# Patient Record
Sex: Female | Born: 1995 | Race: Black or African American | Hispanic: No | Marital: Single | Smoking: Never smoker
Health system: Southern US, Community
[De-identification: ages and names within clinical notes are randomized; demographics above are authoritative.]

---
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
## 2016-01-01 ENCOUNTER — Emergency Department (HOSPITAL_COMMUNITY)
Admission: EM | Admit: 2016-01-01 | Discharge: 2016-01-01 | Disposition: A | Discharge status: Home / Self Care | Payer: Medicaid Other | Attending: Emergency Medicine | Admitting: Emergency Medicine

## 2016-01-01 ENCOUNTER — Encounter (HOSPITAL_COMMUNITY): Payer: Self-pay | Admitting: *Deleted

## 2016-01-01 ENCOUNTER — Emergency Department (HOSPITAL_COMMUNITY): Payer: Medicaid Other

## 2016-01-01 DIAGNOSIS — S7011XA Contusion of right thigh, initial encounter: Secondary | ICD-10-CM | POA: Insufficient documentation

## 2016-01-01 DIAGNOSIS — Z3202 Encounter for pregnancy test, result negative: Secondary | ICD-10-CM | POA: Diagnosis not present

## 2016-01-01 DIAGNOSIS — Y92410 Unspecified street and highway as the place of occurrence of the external cause: Secondary | ICD-10-CM | POA: Insufficient documentation

## 2016-01-01 DIAGNOSIS — Y998 Other external cause status: Secondary | ICD-10-CM | POA: Insufficient documentation

## 2016-01-01 DIAGNOSIS — Y9389 Activity, other specified: Secondary | ICD-10-CM | POA: Insufficient documentation

## 2016-01-01 DIAGNOSIS — S8011XA Contusion of right lower leg, initial encounter: Secondary | ICD-10-CM

## 2016-01-01 DIAGNOSIS — S8991XA Unspecified injury of right lower leg, initial encounter: Secondary | ICD-10-CM | POA: Diagnosis present

## 2016-01-01 LAB — I-STAT BETA HCG BLOOD, ED (MC, WL, AP ONLY)

## 2016-01-01 MED ORDER — TRAMADOL HCL 50 MG PO TABS: 50.0000 mg | ORAL_TABLET | Freq: Four times a day (QID) | ORAL | Status: AC | PRN

## 2016-01-01 MED ORDER — SULFAMETHOXAZOLE-TRIMETHOPRIM 800-160 MG PO TABS: 1.0000 | ORAL_TABLET | Freq: Two times a day (BID) | ORAL | Status: AC

## 2016-01-01 MED ORDER — CYCLOBENZAPRINE HCL 10 MG PO TABS
5.0000 mg | ORAL_TABLET | Freq: Once | ORAL | Status: AC
  Administered 2016-01-01: 5 mg via ORAL
  Filled 2016-01-01: qty 1

## 2016-01-01 MED ORDER — IBUPROFEN 800 MG PO TABS: 800.0000 mg | ORAL_TABLET | Freq: Three times a day (TID) | ORAL | Status: DC

## 2016-01-01 MED ORDER — TRAMADOL HCL 50 MG PO TABS
50.0000 mg | ORAL_TABLET | Freq: Once | ORAL | Status: AC
  Administered 2016-01-01: 50 mg via ORAL
  Filled 2016-01-01: qty 1

## 2016-01-01 MED ORDER — CYCLOBENZAPRINE HCL 5 MG PO TABS: 5.0000 mg | ORAL_TABLET | Freq: Two times a day (BID) | ORAL | Status: AC | PRN

## 2016-01-01 NOTE — ED Notes (Signed)
Pt taken to xray 
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
## 2016-01-01 NOTE — ED Notes (Signed)
Pt able to ambulate without assistance.  
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
## 2016-02-28 ENCOUNTER — Emergency Department (HOSPITAL_COMMUNITY)
Admission: EM | Admit: 2016-02-28 | Discharge: 2016-02-28 | Disposition: A | Discharge status: Home / Self Care | Payer: Medicaid Other | Attending: Emergency Medicine | Admitting: Emergency Medicine

## 2016-02-28 ENCOUNTER — Encounter (HOSPITAL_COMMUNITY): Payer: Self-pay

## 2016-02-28 DIAGNOSIS — Z113 Encounter for screening for infections with a predominantly sexual mode of transmission: Secondary | ICD-10-CM | POA: Insufficient documentation

## 2016-02-28 DIAGNOSIS — Z79899 Other long term (current) drug therapy: Secondary | ICD-10-CM | POA: Insufficient documentation

## 2016-02-28 NOTE — ED Provider Notes (Signed)
[facility]EM[facility]Y DEPT Provider Note   CSN: [id number] Arrival date & time: 02/28/16  4098  First Provider Contact:  First MD Initiated Contact with Patient 02/28/16 1046     History   Chief Complaint Encounter for STI screening   HPI  Misty Boone is an 20 y.o. female with no significant PMH who presents to the ED for STI screening. She is asymptomatic and has no complaints. She specifically is inquiring about herpes testing as she was told by a friend that a previous sexual partner has herpes. She denies rash or new lesions. Denies vaginal discharge or bleeding. Denies abdominal pain, n/v/d, fever, chills.  History reviewed. No pertinent past medical history.  There are no active problems to display for this patient.   History reviewed. No pertinent surgical history.  OB History    No data available       Home Medications    Prior to Admission medications   Medication Sig Start Date End Date Taking? Authorizing Provider  cyclobenzaprine (FLEXERIL) 5 MG tablet Take 1 tablet (5 mg total) by mouth 2 (two) times daily as needed. 01/01/16   Tiffany Neva Seat, PA-C  ibuprofen (ADVIL,MOTRIN) 800 MG tablet Take 1 tablet (800 mg total) by mouth 3 (three) times daily. 01/01/16   Tiffany Neva Seat, PA-C  traMADol (ULTRAM) 50 MG tablet Take 1 tablet (50 mg total) by mouth every 6 (six) hours as needed. 01/01/16   [name], PA-C    Family History No family history on file.  Social History Social History  Substance Use Topics  . Smoking status: Never Smoker  . Smokeless tobacco: Never Used  . Alcohol use No     Allergies   Review of patient's allergies indicates no known allergies.   Review of Systems Review of Systems  All other systems reviewed and are negative.    Physical Exam Updated Vital Signs BP 122/59   Pulse 80   Temp 98.6 F (37 C) (Oral)   Resp 16   SpO2 100%   Physical Exam  Constitutional: She is oriented to person, place, and time. No distress.    HENT:  Head: Atraumatic.  Right Ear: External ear normal.  Left Ear: External ear normal.  Nose: Nose normal.  Eyes: Conjunctivae are normal. No scleral icterus.  Cardiovascular: Normal rate and regular rhythm.   Pulmonary/Chest: Effort normal. No respiratory distress.  Abdominal: She exhibits no distension.  Neurological: She is alert and oriented to person, place, and time.  Skin: Skin is warm and dry. She is not diaphoretic.  Psychiatric: She has a normal mood and affect. Her behavior is normal.  Nursing note and vitals reviewed.    ED Treatments / Results  Labs (all labs ordered are listed, but only abnormal results are displayed) Labs Reviewed  HIV ANTIBODY (ROUTINE TESTING)  RPR  GC/CHLAMYDIA PROBE AMP ([place]) NOT AT [facility]    EKG  EKG Interpretation None       Radiology No results found.  Procedures Procedures (including critical care time)  Medications Ordered in ED Medications - No data to display   Initial Impression / Assessment and Plan / ED Course  I have reviewed the triage vital signs and the nursing notes.  Pertinent labs & imaging results that were available during my care of the patient were reviewed by me and considered in my medical decision making (see chart for details).  Clinical Course    Will send HIV, RPR, GC, chlamydia. I discussed with pt that there  is no good testing for HSV in asymptomatic people, nor is there good prophylactic treatment. No other tx today since pt is asymptomatic. Encouraged safe sex practices and ER return precautions were given.  Final Clinical Impressions(s) / ED Diagnoses   Final diagnoses:  Encounter for screening examination for sexually transmitted disease    New Prescriptions New Prescriptions   No medications on file     Carlene Coria, Cordelia Poche 02/28/16 1125    Pricilla Loveless, MD 03/03/16 [phone]

## 2016-02-28 NOTE — ED Triage Notes (Signed)
Patient was told by 3rd party that she may have been exposed to STD, no complaints

## 2016-02-28 NOTE — Discharge Instructions (Signed)
We will call you if any of your tests come back positive.

## 2016-02-29 LAB — GC/CHLAMYDIA PROBE AMP (~~LOC~~) NOT AT ARMC
CHLAMYDIA, DNA PROBE: NEGATIVE
Neisseria Gonorrhea: NEGATIVE

## 2016-02-29 LAB — HIV ANTIBODY (ROUTINE TESTING W REFLEX): HIV Screen 4th Generation wRfx: NONREACTIVE

## 2016-02-29 LAB — RPR: RPR Ser Ql: NONREACTIVE

## 2016-08-14 ENCOUNTER — Emergency Department (HOSPITAL_COMMUNITY): Payer: Self-pay

## 2016-08-14 ENCOUNTER — Encounter (HOSPITAL_COMMUNITY): Payer: Self-pay | Admitting: *Deleted

## 2016-08-14 ENCOUNTER — Emergency Department (HOSPITAL_COMMUNITY)
Admission: EM | Admit: 2016-08-14 | Discharge: 2016-08-14 | Disposition: A | Discharge status: Home / Self Care | Payer: Self-pay | Attending: Emergency Medicine | Admitting: Emergency Medicine

## 2016-08-14 DIAGNOSIS — Y999 Unspecified external cause status: Secondary | ICD-10-CM | POA: Insufficient documentation

## 2016-08-14 DIAGNOSIS — S8001XA Contusion of right knee, initial encounter: Secondary | ICD-10-CM | POA: Insufficient documentation

## 2016-08-14 DIAGNOSIS — Y929 Unspecified place or not applicable: Secondary | ICD-10-CM | POA: Insufficient documentation

## 2016-08-14 DIAGNOSIS — Y939 Activity, unspecified: Secondary | ICD-10-CM | POA: Insufficient documentation

## 2016-08-14 DIAGNOSIS — M25561 Pain in right knee: Secondary | ICD-10-CM
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
## 2016-08-14 NOTE — ED Notes (Signed)
No answer x2 

## 2016-08-14 NOTE — Discharge Instructions (Signed)
Wear knee sleeve for at least 2 weeks for stabilization of knee. Use crutches as needed for comfort. Ice and elevate knee throughout the day, using ice pack for no more than 20 minutes every hour. Alternate between tylenol and motrin as needed for pain relief. Call orthopedic follow up today or tomorrow to schedule followup appointment for recheck of ongoing knee pain in 1-2 weeks that can be canceled with a 24-48 hour notice if complete resolution of pain. Return to the ER for changes or worsening symptoms. °
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
## 2017-02-27 ENCOUNTER — Encounter (HOSPITAL_COMMUNITY): Payer: Self-pay | Admitting: Emergency Medicine

## 2017-02-27 ENCOUNTER — Emergency Department (HOSPITAL_COMMUNITY)
Admission: EM | Admit: 2017-02-27 | Discharge: 2017-02-27 | Disposition: A | Discharge status: Home / Self Care | Payer: Medicaid Other | Attending: Emergency Medicine | Admitting: Emergency Medicine

## 2017-02-27 DIAGNOSIS — R0789 Other chest pain: Secondary | ICD-10-CM

## 2017-02-27 DIAGNOSIS — K5900 Constipation, unspecified: Secondary | ICD-10-CM | POA: Insufficient documentation

## 2017-02-27 DIAGNOSIS — K602 Anal fissure, unspecified: Secondary | ICD-10-CM | POA: Insufficient documentation

## 2017-02-27 LAB — COMPREHENSIVE METABOLIC PANEL
ALBUMIN: 3.8 g/dL (ref 3.5–5.0)
ALK PHOS: 81 U/L (ref 38–126)
ALT: 19 U/L (ref 14–54)
AST: 21 U/L (ref 15–41)
Anion gap: 9 (ref 5–15)
BILIRUBIN TOTAL: 0.3 mg/dL (ref 0.3–1.2)
BUN: 10 mg/dL (ref 6–20)
CHLORIDE: 107 mmol/L (ref 101–111)
CO2: 23 mmol/L (ref 22–32)
CREATININE: 0.53 mg/dL (ref 0.44–1.00)
Calcium: 8.9 mg/dL (ref 8.9–10.3)
GFR calc non Af Amer: >60 mL/min (ref >60)
Glucose, Bld: 104 mg/dL — ABNORMAL HIGH (ref 65–99)
POTASSIUM: 3.8 mmol/L (ref 3.5–5.1)
SODIUM: 139 mmol/L (ref 135–145)
TOTAL PROTEIN: 7.6 g/dL (ref 6.5–8.1)

## 2017-02-27 LAB — I-STAT BETA HCG BLOOD, ED (MC, WL, AP ONLY)

## 2017-02-27 LAB — CBC
HCT: 36.1 % (ref 36.0–46.0)
Hemoglobin: 12.5 g/dL (ref 12.0–15.0)
MCH: 29.4 pg (ref 26.0–34.0)
MCHC: 34.6 g/dL (ref 30.0–36.0)
MCV: 84.9 fL (ref 78.0–100.0)
PLATELETS: 368 10*3/uL (ref 150–400)
RBC: 4.25 MIL/uL (ref 3.87–5.11)
RDW: 12.8 % (ref 11.5–15.5)
WBC: 7 10*3/uL (ref 4.0–10.5)

## 2017-02-27 LAB — ABO/RH: ABO/RH(D): O POS

## 2017-02-27 LAB — TYPE AND SCREEN
ABO/RH(D): O POS
ANTIBODY SCREEN: NEGATIVE

## 2017-02-27 NOTE — Discharge Instructions (Signed)
Recommend ibuprofen and warm compresses for the soreness in your chest. Recommend a high fiber diet for constipation, as well as 3-4 days of daily Miralax and a stool softener until constipation resolves. Tuck's medicated pads (sold over the counter) can offer relief of the pain of the fissure.

## 2017-02-27 NOTE — ED Triage Notes (Signed)
Pt reports R upper breast pain for the past 2 days.  Pt also reports she had dark tarry stool today. No abd pain or emesis.

## 2017-02-27 NOTE — ED Provider Notes (Signed)
[facility]-EMERGENCY DEPT Provider Note   CSN: [id number] Arrival date & time: 02/27/17  1906     History   Chief Complaint Chief Complaint  Patient presents with  . Breast Pain  . Rectal Bleeding    HPI Misty Boone is a 21 y.o. female.  Patient presents with complaint of right breast pain for the past 2 days. No left sided pain. No nipple discharge or bleeding. No known injury. She also reports bleeding with bowel movement this morning. She has had 2 bowel movements today. Bleeding was with the first and not seen with the second. She has been constipated and states she had to strain for the initial BM today. No abdominal pain. No melena.   The history is provided by the patient. No language interpreter was used.  Rectal Bleeding  Associated symptoms: no abdominal pain, no fever and no vomiting     History reviewed. No pertinent past medical history.  There are no active problems to display for this patient.   History reviewed. No pertinent surgical history.  OB History    No data available       Home Medications    Prior to Admission medications   Medication Sig Start Date End Date Taking? Authorizing Provider  cyclobenzaprine (FLEXERIL) 5 MG tablet Take 1 tablet (5 mg total) by mouth 2 (two) times daily as needed. Patient not taking: Reported on 02/27/2017 01/01/16   Marlon PelGreene, Tiffany, PA-C  ibuprofen (ADVIL,MOTRIN) 800 MG tablet Take 1 tablet (800 mg total) by mouth 3 (three) times daily. Patient not taking: Reported on 02/27/2017 01/01/16   Marlon PelGreene, Tiffany, PA-C  traMADol (ULTRAM) 50 MG tablet Take 1 tablet (50 mg total) by mouth every 6 (six) hours as needed. Patient not taking: Reported on 02/27/2017 01/01/16   Marlon PelGreene, Tiffany, PA-C    Family History History reviewed. No pertinent family history.  Social History Social History  Substance Use Topics  . Smoking status: Never Smoker  . Smokeless tobacco: Never Used  . Alcohol use No     Allergies   Patient has  no known allergies.   Review of Systems Review of Systems  Constitutional: Negative for chills and fever.  Respiratory: Negative.   Cardiovascular: Positive for chest pain (c/o right breast pain).  Gastrointestinal: Positive for blood in stool, constipation and hematochezia. Negative for abdominal pain, nausea and vomiting.  Genitourinary: Negative.   Musculoskeletal: Negative.  Negative for back pain.  Skin: Negative.   Neurological: Negative.      Physical Exam Updated Vital Signs BP 112/84 (BP Location: Left Arm)   Pulse 85   Temp 98.2 F (36.8 C) (Oral)   Resp 18   SpO2 100%   Physical Exam  Constitutional: She appears well-developed and well-nourished.  HENT:  Head: Normocephalic.  Neck: Normal range of motion. Neck supple.  Cardiovascular: Normal rate.   Pulmonary/Chest: Effort normal. She exhibits tenderness.    No breast mass palpable. Tender to right parasternal upper chest. No swelling discoloration or wound.  Abdominal: Soft. Bowel sounds are normal. She exhibits no distension. There is no tenderness. There is no rebound and no guarding.  Genitourinary:  Genitourinary Comments: Small fissure at the 6:00 o'clock position of rectum.  Musculoskeletal: Normal range of motion.  Neurological: She is alert.  Skin: Skin is warm and dry. No rash noted.  Psychiatric: She has a normal mood and affect.     ED Treatments / Results  Labs (all labs ordered are listed, but only abnormal results  are displayed) Labs Reviewed  COMPREHENSIVE METABOLIC PANEL - Abnormal; Notable for the following:       Result Value   Glucose, Bld 104 (*)    All other components within normal limits  CBC  I-STAT BETA HCG BLOOD, ED ([facility], [facility], [facility] ONLY)  POC OCCULT BLOOD, ED  TYPE AND SCREEN  ABO/RH   Results for orders placed or performed during the hospital encounter of 02/27/17  Comprehensive metabolic panel  Result Value Ref Range   Sodium 139 135 - 145 mmol/L   Potassium 3.8 3.5 -  5.1 mmol/L   Chloride 107 101 - 111 mmol/L   CO2 23 22 - 32 mmol/L   Glucose, Bld 104 (H) 65 - 99 mg/dL   BUN 10 6 - 20 mg/dL   Creatinine, Ser 1.610.53 0.44 - 1.00 mg/dL   Calcium 8.9 8.9 - 09.610.3 mg/dL   Total Protein 7.6 6.5 - 8.1 g/dL   Albumin 3.8 3.5 - 5.0 g/dL   AST 21 15 - 41 U/L   ALT 19 14 - 54 U/L   Alkaline Phosphatase 81 38 - 126 U/L   Total Bilirubin 0.3 0.3 - 1.2 mg/dL   GFR calc non Af Amer >60 >60 mL/min   GFR calc Af Amer >60 >60 mL/min   Anion gap 9 5 - 15  CBC  Result Value Ref Range   WBC 7.0 4.0 - 10.5 K/uL   RBC 4.25 3.87 - 5.11 MIL/uL   Hemoglobin 12.5 12.0 - 15.0 g/dL   HCT 04.536.1 40.936.0 - 81.146.0 %   MCV 84.9 78.0 - 100.0 fL   MCH 29.4 26.0 - 34.0 pg   MCHC 34.6 30.0 - 36.0 g/dL   RDW 91.412.8 78.211.5 - 95.615.5 %   Platelets 368 150 - 400 K/uL  I-Stat beta hCG blood, ED  Result Value Ref Range   I-stat hCG, quantitative <5.0 <5 mIU/mL   Comment 3          [facility]  Result Value Ref Range   ABO/RH(D) O POS    Antibody Screen NEG    Sample Expiration 03/02/2017   ABO/Rh  Result Value Ref Range   ABO/RH(D) O POS     EKG  EKG Interpretation None       Radiology No results found.  Procedures Procedures (including critical care time)  Medications Ordered in ED Medications - No data to display   Initial Impression / Assessment and Plan / ED Course  I have reviewed the triage vital signs and the nursing notes.  Pertinent labs & imaging results that were available during my care of the patient were reviewed by me and considered in my medical decision making (see chart for details).     Patient presents with concern for right breast pain x 2 days. Breast exam unremarkable. There is chest wall tenderness without evidence of infection.   She has been constipated and today noticed bleeding with bowel movement. She is found to have an anal fissure. No concern for GI bleeding.   She appears well and can be discharged home  with recommendations for supportive care.   Final Clinical Impressions(s) / ED Diagnoses   Final diagnoses:  None   1. Anal fissure 2. Chest wall pain  New Prescriptions New Prescriptions   No medications on file     Danne HarborUpstill, Sydelle Sherfield, PA-C 02/27/17 2331    Mancel BaleWentz, Elliott, MD 02/28/17 0002

## 2017-03-13 ENCOUNTER — Encounter (HOSPITAL_COMMUNITY): Payer: Self-pay | Admitting: Emergency Medicine

## 2017-03-13 DIAGNOSIS — L02412 Cutaneous abscess of left axilla: Secondary | ICD-10-CM | POA: Insufficient documentation

## 2017-03-13 NOTE — ED Triage Notes (Signed)
Pt reports having abscess under left axilla that has been ongoing for the last 3 days. Pt reports abscess is appx size of quarter in diameter.

## 2017-03-14 ENCOUNTER — Emergency Department (HOSPITAL_COMMUNITY)
Admission: EM | Admit: 2017-03-14 | Discharge: 2017-03-14 | Disposition: A | Discharge status: Home / Self Care | Payer: Medicaid Other | Attending: Emergency Medicine | Admitting: Emergency Medicine

## 2017-03-14 DIAGNOSIS — L02412 Cutaneous abscess of left axilla: Secondary | ICD-10-CM

## 2017-03-14 MED ORDER — IBUPROFEN 800 MG PO TABS: 800.0000 mg | ORAL_TABLET | Freq: Three times a day (TID) | ORAL | 0 refills | Status: AC

## 2017-03-14 MED ORDER — LIDOCAINE-EPINEPHRINE 2 %-1:200000 IJ SOLN
20.0000 mL | Freq: Once | INTRAMUSCULAR | Status: AC
Start: 2017-03-14 — End: 2017-03-14
  Administered 2017-03-14: 20 mL
  Filled 2017-03-14: qty 20

## 2017-03-14 NOTE — ED Notes (Signed)
I&D supplies at bedside

## 2017-03-14 NOTE — ED Provider Notes (Signed)
[facility]-EMERGENCY DEPT Provider Note   CSN: [id number] Arrival date & time: 03/13/17  2154     History   Chief Complaint Chief Complaint  Patient presents with  . Abscess    HPI Misty Boone is a 21 y.o. female.  The history is provided by the patient. No language interpreter was used.  Abscess  Abscess location: Axilla. Size:  1x4cm Abscess quality: painful and redness   Red streaking: no   Duration:  3 days Progression:  Worsening Pain details:    Quality:  Aching and throbbing   Severity:  Moderate   Duration:  3 days   Progression:  Worsening Chronicity:  Recurrent Relieved by:  Nothing Exacerbated by: movement of the left arm. Ineffective treatments:  None tried Associated symptoms: no fatigue, no fever, no nausea and no vomiting   Risk factors: prior abscess     History reviewed. No pertinent past medical history.  There are no active problems to display for this patient.   History reviewed. No pertinent surgical history.  OB History    No data available       Home Medications    Prior to Admission medications   Medication Sig Start Date End Date Taking? Authorizing Provider  cyclobenzaprine (FLEXERIL) 5 MG tablet Take 1 tablet (5 mg total) by mouth 2 (two) times daily as needed. Patient not taking: Reported on 02/27/2017 01/01/16   [name], PA-C  ibuprofen (ADVIL,MOTRIN) 800 MG tablet Take 1 tablet (800 mg total) by mouth 3 (three) times daily. 03/14/17   Antony Madura, PA-C  traMADol (ULTRAM) 50 MG tablet Take 1 tablet (50 mg total) by mouth every 6 (six) hours as needed. Patient not taking: Reported on 02/27/2017 01/01/16   [name], PA-C    Family History History reviewed. No pertinent family history.  Social History Social History  Substance Use Topics  . Smoking status: Never Smoker  . Smokeless tobacco: Never Used  . Alcohol use No     Allergies   Patient has no known allergies.   Review of Systems Review of Systems    Constitutional: Negative for fatigue and fever.  Gastrointestinal: Negative for nausea and vomiting.   Ten systems reviewed and are negative for acute change, except as noted in the HPI.    Physical Exam Updated Vital Signs BP (!) 142/74 (BP Location: Right Arm)   Pulse 91   Temp 98.9 F (37.2 C) (Oral)   Resp 18   Ht 5\' 1"  (1.549 m)   Wt 78.5 kg (173 lb 1.6 oz)   LMP 02/10/2017   SpO2 98%   BMI 32.71 kg/m   Physical Exam  Constitutional: She is oriented to person, place, and time. She appears well-developed and well-nourished. No distress.  Nontoxic and in NAD  HENT:  Head: Normocephalic and atraumatic.  Eyes: Conjunctivae and EOM are normal. No scleral icterus.  Neck: Normal range of motion.  Pulmonary/Chest: Effort normal. No respiratory distress.  Respirations even and unlabored.  Musculoskeletal: Normal range of motion.  Neurological: She is alert and oriented to person, place, and time. She exhibits normal muscle tone. Coordination normal.  GCS 15. Patient moving all extremities.  Skin: Skin is warm and dry. No rash noted. She is not diaphoretic. No pallor.  Tender abscess, 1cm x 4cm, to the L axilla. Mild erythema. No significant heat to touch. No drainage.  Psychiatric: She has a normal mood and affect. Her behavior is normal.  Nursing note and vitals reviewed.  ED Treatments / Results  Labs (all labs ordered are listed, but only abnormal results are displayed) Labs Reviewed - No data to display  EKG  EKG Interpretation None       Radiology No results found.  Procedures Procedures (including critical care time)  Medications Ordered in ED Medications  lidocaine-EPINEPHrine (XYLOCAINE W/EPI) 2 %-1:200000 (PF) injection 20 mL (20 mLs Infiltration Given by Other 03/14/17 0116)    INCISION AND DRAINAGE Performed by: Antony MaduraHUMES, Merrily Tegeler Consent: Verbal consent obtained. Risks and benefits: risks, benefits and alternatives were discussed Type:  abscess  Body area: L axilla  Anesthesia: local infiltration  Incision was made with a scalpel.  Local anesthetic: lidocaine 2% with epinephrine  Anesthetic total: 4 ml  Complexity: complex Blunt dissection to break up loculations  Drainage: purulent  Drainage amount: moderate  Packing material: none  Patient tolerance: Patient tolerated the procedure well with no immediate complications.     Initial Impression / Assessment and Plan / ED Course  I have reviewed the triage vital signs and the nursing notes.  Pertinent labs & imaging results that were available during my care of the patient were reviewed by me and considered in my medical decision making (see chart for details).     Patient with skin abscess amenable to incision and drainage. Abscess was not large enough to warrant packing or drain, wound recheck in 2 days advised. Encouraged home warm soaks and flushing. Mild signs of cellulitis is surrounding skin. Will d/c to home. No antibiotic therapy is indicated. Return precautions discussed and provided. Patient discharged in stable condition with no unaddressed concerns.   Final Clinical Impressions(s) / ED Diagnoses   Final diagnoses:  Abscess of left axilla    New Prescriptions Current Discharge Medication List       Antony MaduraHumes, Edie Darley, Cordelia Poche-C 03/14/17 0231    Dione BoozeGlick, David, MD 03/14/17 [phone]
